# Patient Record
Sex: Male | Born: 1960 | Hispanic: No | Marital: Married | Smoking: Never smoker
Health system: Southern US, Community
[De-identification: ages and names within clinical notes are randomized; demographics above are authoritative.]

## PROBLEM LIST (undated history)

## (undated) DIAGNOSIS — I10 Essential (primary) hypertension: Secondary | ICD-10-CM

---

## 2020-06-15 ENCOUNTER — Other Ambulatory Visit: Payer: Self-pay

## 2020-06-15 ENCOUNTER — Emergency Department (HOSPITAL_COMMUNITY): Payer: BC Managed Care – PPO

## 2020-06-15 ENCOUNTER — Encounter (HOSPITAL_COMMUNITY): Payer: Self-pay | Admitting: Emergency Medicine

## 2020-06-15 ENCOUNTER — Emergency Department (HOSPITAL_COMMUNITY)
Admission: EM | Admit: 2020-06-15 | Discharge: 2020-06-15 | Disposition: A | Discharge status: Home / Self Care | Payer: BC Managed Care – PPO | Attending: Emergency Medicine | Admitting: Emergency Medicine

## 2020-06-15 DIAGNOSIS — M25551 Pain in right hip: Secondary | ICD-10-CM | POA: Diagnosis present

## 2020-06-15 DIAGNOSIS — M7918 Myalgia, other site: Secondary | ICD-10-CM

## 2020-06-15 DIAGNOSIS — W19XXXA Unspecified fall, initial encounter: Secondary | ICD-10-CM

## 2020-06-15 DIAGNOSIS — I10 Essential (primary) hypertension: Secondary | ICD-10-CM | POA: Insufficient documentation

## 2020-06-15 DIAGNOSIS — W010XXA Fall on same level from slipping, tripping and stumbling without subsequent striking against object, initial encounter: Secondary | ICD-10-CM | POA: Diagnosis not present

## 2020-06-15 HISTORY — DX: Essential (primary) hypertension: I10

## 2020-06-15 LAB — BASIC METABOLIC PANEL
Anion gap: 10 (ref 5–15)
BUN: 18 mg/dL (ref 6–20)
CO2: 23 mmol/L (ref 22–32)
Calcium: 9.3 mg/dL (ref 8.9–10.3)
Chloride: 104 mmol/L (ref 98–111)
Creatinine, Ser: 1.33 mg/dL — ABNORMAL HIGH (ref 0.61–1.24)
GFR, Estimated: >60 mL/min (ref >60)
Glucose, Bld: 99 mg/dL (ref 70–99)
Potassium: 4 mmol/L (ref 3.5–5.1)
Sodium: 137 mmol/L (ref 135–145)

## 2020-06-15 LAB — CBC
HCT: 48.5 % (ref 39.0–52.0)
Hemoglobin: 15.7 g/dL (ref 13.0–17.0)
MCH: 29.2 pg (ref 26.0–34.0)
MCHC: 32.4 g/dL (ref 30.0–36.0)
MCV: 90.1 fL (ref 80.0–100.0)
Platelets: 278 10*3/uL (ref 150–400)
RBC: 5.38 MIL/uL (ref 4.22–5.81)
RDW: 13.6 % (ref 11.5–15.5)
WBC: 9.3 10*3/uL (ref 4.0–10.5)
nRBC: 0 % (ref 0.0–0.2)

## 2020-06-15 LAB — TROPONIN I (HIGH SENSITIVITY): Troponin I (High Sensitivity): 12 ng/L (ref <18)

## 2020-06-15 MED ORDER — NAPROXEN 250 MG PO TABS
500.0000 mg | ORAL_TABLET | Freq: Once | ORAL | Status: AC
  Administered 2020-06-15: 500 mg via ORAL
  Filled 2020-06-15: qty 2

## 2020-06-15 MED ORDER — LIDOCAINE 4 % EX PTCH: 1.0000 | MEDICATED_PATCH | Freq: Every day | CUTANEOUS | 0 refills | Status: AC

## 2020-06-15 NOTE — ED Notes (Signed)
Pt taken to xray at this time.

## 2020-06-15 NOTE — ED Notes (Signed)
Patient verbalizes understanding of discharge instructions. Prescriptions reviewed. Opportunity for questioning and answers were provided. Armband removed by staff, pt discharged from ED ambulatory. ° °

## 2020-06-15 NOTE — Discharge Instructions (Addendum)
The images that we obtained of your back today show a possible fracture to the second lumbar vertebra osteophyte, recommend you follow-up with your primary care doctor as well as the orthopedic service, I have provided their number on your discharge paperwork. Lidocaine patches were called in your pharmacy, please apply 1 patch over the area of your maximum pain daily, do not apply more than 1 patch at a time. Please also use Tylenol and ibuprofen as needed for pain. please return the emergency department if the pain worsens, you develop weakness in your leg, or unable to walk, or for any other emergent concern.

## 2020-06-15 NOTE — ED Notes (Signed)
Patient ambulate well

## 2020-06-15 NOTE — ED Provider Notes (Signed)
[facility] EMERGENCY DEPARTMENT Provider Note   CSN: [id number] Arrival date & time: 06/15/20  1716     History Chief Complaint  Patient presents with  . Fall  . Hip Pain  . Hypertension    Andre Harrington is a 60 y.o. male.  The history is provided by the patient.  Fall This is a new problem. The current episode started 2 days ago. The problem occurs constantly. The problem has not changed since onset.Pertinent negatives include no chest pain, no abdominal pain and no shortness of breath. The symptoms are aggravated by walking and standing. The symptoms are relieved by rest.  Hip Pain This is a new problem. The current episode started 2 days ago. Pertinent negatives include no chest pain, no abdominal pain and no shortness of breath.  Hypertension Pertinent negatives include no chest pain, no abdominal pain and no shortness of breath.       Past Medical History:  Diagnosis Date  . Hypertension     There are no problems to display for this patient.   History reviewed. No pertinent surgical history.     No family history on file.  Social History   Tobacco Use  . Smoking status: Never Smoker  . Smokeless tobacco: Never Used  Substance Use Topics  . Alcohol use: Not Currently  . Drug use: Not Currently    Home Medications Prior to Admission medications   Medication Sig Start Date End Date Taking? Authorizing Provider  bisoprolol (ZEBETA) 5 MG tablet Take 5 mg by mouth daily.   Yes [provider]  Lidocaine (HM LIDOCAINE PATCH) 4 % [facility] Apply 1 patch topically daily. 06/15/20  Yes Kathleen Lime, MD    Allergies    Patient has no known allergies.  Review of Systems   Review of Systems  Constitutional: Negative for chills and fever.  HENT: Negative for ear pain and sore throat.   Eyes: Negative for pain and visual disturbance.  Respiratory: Negative for cough and shortness of breath.   Cardiovascular: Negative for chest pain  and palpitations.  Gastrointestinal: Negative for abdominal pain and vomiting.  Genitourinary: Negative for dysuria and hematuria.  Musculoskeletal: Negative for arthralgias and back pain.       R buttock pain  Skin: Negative for color change and rash.  Neurological: Negative for seizures and syncope.  All other systems reviewed and are negative.   Physical Exam Updated Vital Signs BP (!) 166/110   Pulse 70   Temp 98.6 F (37 C) (Oral)   Resp 15   SpO2 98%   Physical Exam Vitals and nursing note reviewed.  Constitutional:      Appearance: He is well-developed and well-nourished.  HENT:     Head: Normocephalic and atraumatic.  Eyes:     Conjunctiva/sclera: Conjunctivae normal.  Cardiovascular:     Rate and Rhythm: Normal rate and regular rhythm.     Heart sounds: No murmur heard.   Pulmonary:     Effort: Pulmonary effort is normal. No respiratory distress.     Breath sounds: Normal breath sounds.  Abdominal:     Palpations: Abdomen is soft.     Tenderness: There is no abdominal tenderness.  Musculoskeletal:        General: No edema.     Cervical back: Neck supple.     Lumbar back: Normal range of motion.     Comments: Mild right SI joint tenderness, no significant midline back tenderness. 5/5 strength bilateral lower extremities.  Stable gait.   Skin:    General: Skin is warm and dry.  Neurological:     Mental Status: He is alert.     GCS: GCS eye subscore is 4. GCS verbal subscore is 5. GCS motor subscore is 6.     Sensory: Sensation is intact. No sensory deficit.     Motor: No weakness.     Deep Tendon Reflexes:     Reflex Scores:      Achilles reflexes are 2+ on the right side and 2+ on the left side. Psychiatric:        Mood and Affect: Mood and affect normal.     ED Results / Procedures / Treatments   Labs (all labs ordered are listed, but only abnormal results are displayed) Labs Reviewed  BASIC METABOLIC PANEL - Abnormal; Notable for the following  components:      Result Value   Creatinine, Ser 1.33 (*)    All other components within normal limits  CBC  TROPONIN I (HIGH SENSITIVITY)  TROPONIN I (HIGH SENSITIVITY)    EKG EKG Interpretation  Date/Time:  Sunday June 15 2020 17:53:57 EST Ventricular Rate:  88 PR Interval:  150 QRS Duration: 100 QT Interval:  386 QTC Calculation: 467 R Axis:   26 Text Interpretation: Normal sinus rhythm Left ventricular hypertrophy with repolarization abnormality ( R in aVL , Sokolow-Lyon , Cornell product , Romhilt-Estes ) Lateral infarct , age undetermined T wave abnormality Abnormal ECG Confirmed by Gerhard Munch [phone]) on 06/15/2020 6:42:31 PM   Radiology DG Lumbar Spine Complete  Result Date: 06/15/2020 CLINICAL DATA:  Fall with lower lumbar and right SI joint pain. Slip and fall 2 days ago. EXAM: LUMBAR SPINE - COMPLETE 4+ VIEW COMPARISON:  None. FINDINGS: Normal alignment. Endplate spurring at multiple levels. There is a lucency through the right aspect of L2 peripheral osteophyte, cannot exclude osteophyte fracture. The vertebral body heights are normal. The sacroiliac joints are congruent with mild degenerative change. Minor disc space narrowing throughout the lumbar spine. Mild lower lumbar facet hypertrophy. IMPRESSION: 1. Lucency through the right aspect of L2 peripheral osteophyte, cannot exclude osteophyte fracture. 2. Mild multilevel degenerative disc disease with lower lumbar facet hypertrophy. Electronically Signed   By: Narda Rutherford M.D.   On: 06/15/2020 19:37   CT Lumbar Spine Wo Contrast  Result Date: 06/15/2020 CLINICAL DATA:  Lumbar and right SI joint pain after fall. Question fracture on radiograph. EXAM: CT LUMBAR SPINE WITHOUT CONTRAST TECHNIQUE: Multidetector CT imaging of the lumbar spine was performed without intravenous contrast administration. Multiplanar CT image reconstructions were also generated. COMPARISON:  Radiograph earlier today FINDINGS: Segmentation: 5  lumbar type vertebrae. Alignment: Normal. Vertebrae: As seen on radiograph, there is a lucency through anterior osteophyte arising from the inferior aspect of L2 vertebral body that may represent an osteophyte fracture, series 7, image 31, series 4, images 54 and 55. There is no vertebral body involvement. No other fracture. Vertebral body heights are normal. Posterior elements are intact. Symmetric sacroiliac joints with degenerative spurring about the superior aspect. No fracture of the visualized sacrum. Paraspinal and other soft tissues: Negative. Disc levels: Multilevel endplate spurring with primarily anterior osteophytes. Minor disc space narrowing at L2-L3. Mild facet and ligamentum flavum hypertrophy at L4-L5. No high-grade canal stenosis. IMPRESSION: 1. Lucency through anterior osteophyte arising from the inferior aspect of L2 vertebral body may represent an osteophyte fracture. There is no vertebral body involvement. 2. Mild multilevel degenerative disc disease. Electronically Signed  By: Narda Rutherford M.D.   On: 06/15/2020 21:28    Procedures Procedures   Medications Ordered in ED Medications  naproxen (NAPROSYN) tablet 500 mg (500 mg Oral Given 06/15/20 1957)    ED Course  I have reviewed the triage vital signs and the nursing notes.  Pertinent labs & imaging results that were available during my care of the patient were reviewed by me and considered in my medical decision making (see chart for details).    MDM Rules/Calculators/A&P                          This is a 60 year old male with a past medical history of hypertension who presents emergency department for evaluation of right buttock and lower back pain after a fall 2 days ago.  Patient reports that the garbage truck was coming to take his trash out and he was walking back, looking at the garbage truck when his left leg stumbled over a railing that he did not know was there causing him to fall onto his right hip and right  lower backside.  Patient did not hit his head, did not lose consciousness.  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  He does not have any history of back problems, denies any urinary or fecal incontinence, denies saddle paresthesias, denies lower leg weakness/numbness.  On exam the patient is afebrile, hypertensive, no acute distress.  He points to the area overlying his right SI joint and right buttock as the sites of pain when he stands up, however I am unable to reproduce this with deep palpation.  He has equal strength and sensation to bilateral lower extremities, ambulates with steady gait. His pain appears to be MSK as it is worse with standing and moving and improves with rest.  XR of lumbar spine shows lucency through R L2 osteophyte, CT obtained which shows similar findings that are not definitive for fracture, but osteophyte fracture cannot be ruled out. He has no neurovascular deficits, no emergent spine consult required. We will provided him with lidocaine patches, instruct to use tylenol/ibuprofen for pain, and given Spine follow-up if pain not improving. He was hypertensive on arrival and is known to be non-compliant with his BP meds. I discussed the importance to take his medications even if he is not having symptoms. His creatinine is mildly elevated today, but we have no previous for comparison. This is likely secondary to his chronic HTN, he was instructed to call his PCP and f/u for trending and to address possible hypertensive induced CKD.   Final Clinical Impression(s) / ED Diagnoses Final diagnoses:  Fall, initial encounter  Right buttock pain    Rx / DC Orders ED Discharge Orders         Ordered    Lidocaine (HM LIDOCAINE PATCH) 4 % [facility]  Daily        06/15/20 2139            Kathleen Lime, MD 06/16/20 1144    Gerhard Munch, MD 06/19/20 0001

## 2020-06-15 NOTE — ED Triage Notes (Addendum)
Pt slipped and fell on Friday.  C/o pain to R hip.  Ambulatory to triage.  Pt hypertensive.  States he only takes BP medicine when he feels dizzy.

## 2022-07-31 IMAGING — DX DG LUMBAR SPINE COMPLETE 4+V
5 series · 5 of 5 positions shown · non-contrast
Comparison: None.

CLINICAL DATA: Fall with lower lumbar and right SI joint pain. Slip
and fall 2 days ago.

EXAM:
LUMBAR SPINE - COMPLETE 4+ VIEW

[l-spine ap]
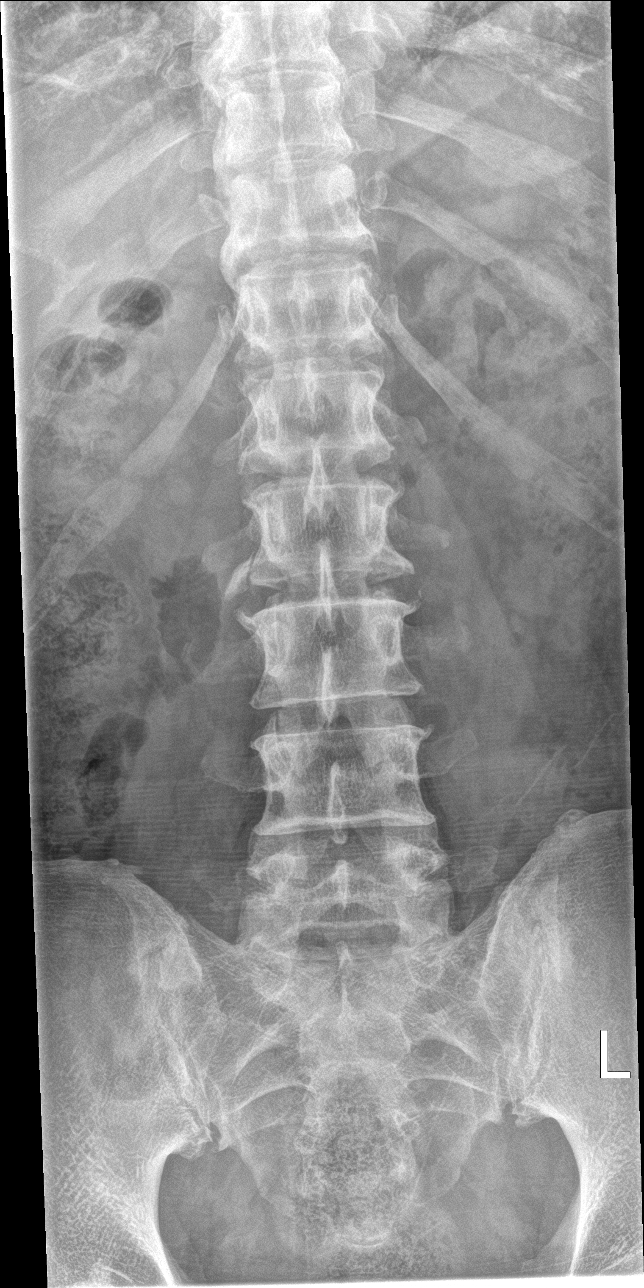

[l-spine obl (1 of 2)]
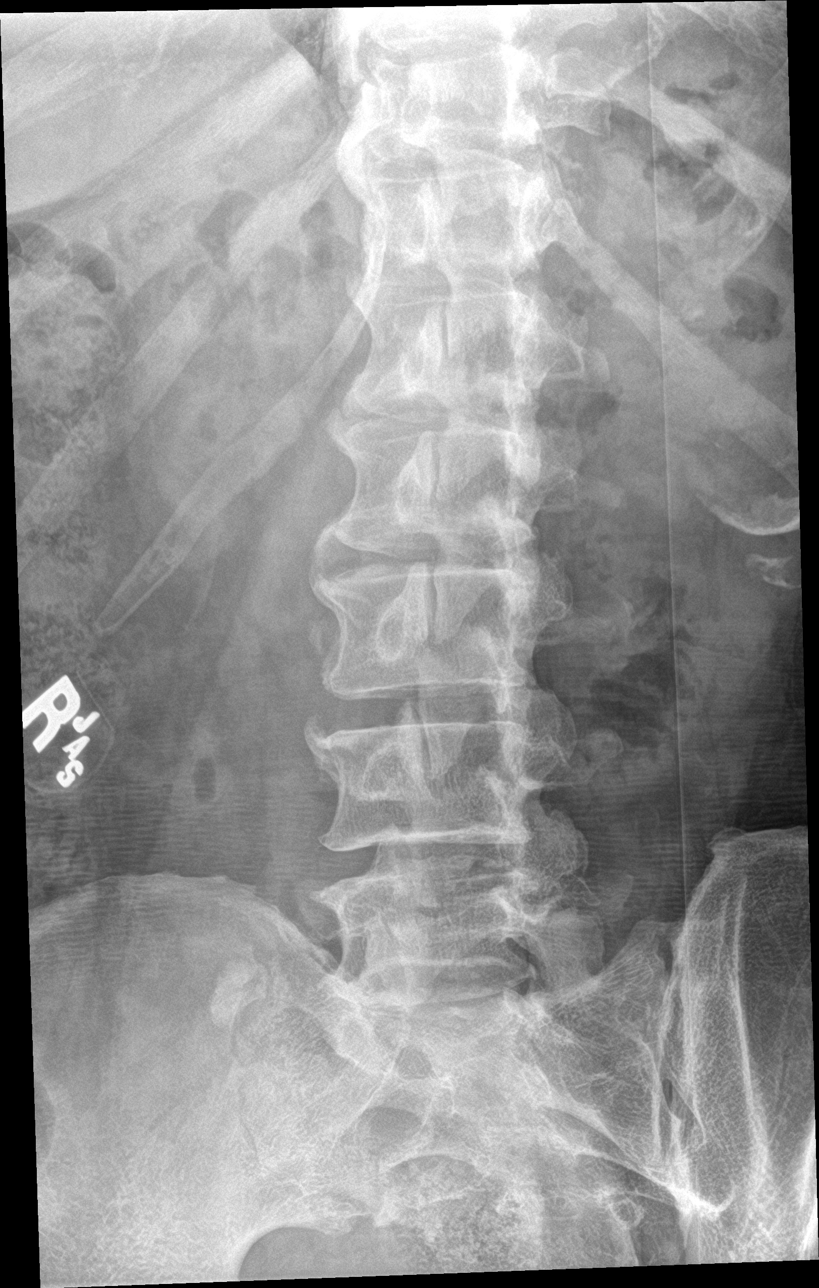

[l-spine obl (2 of 2)]
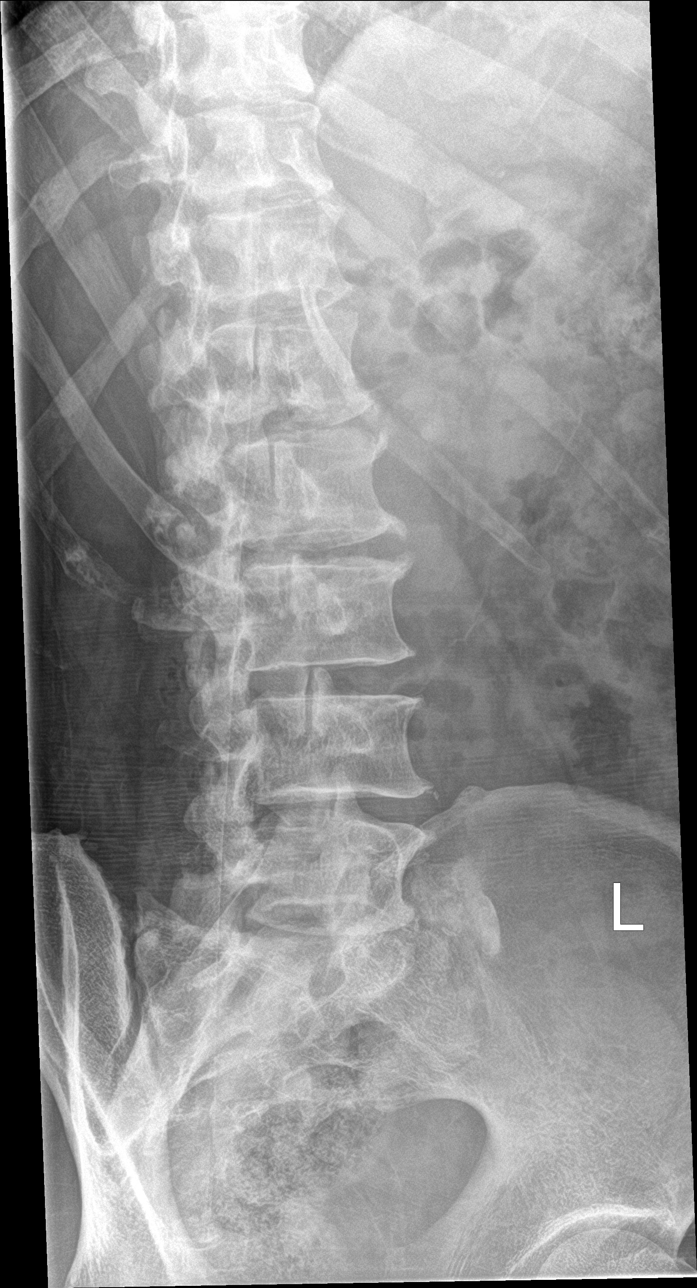

[l-spine lat]
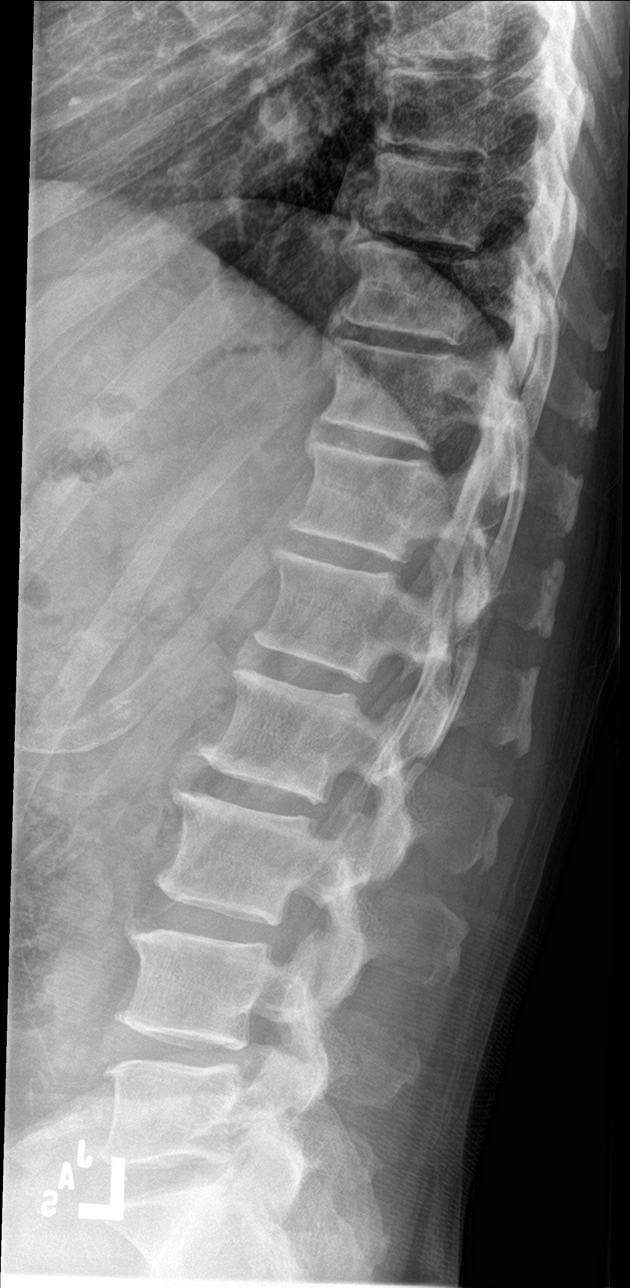

[l-spine spot]
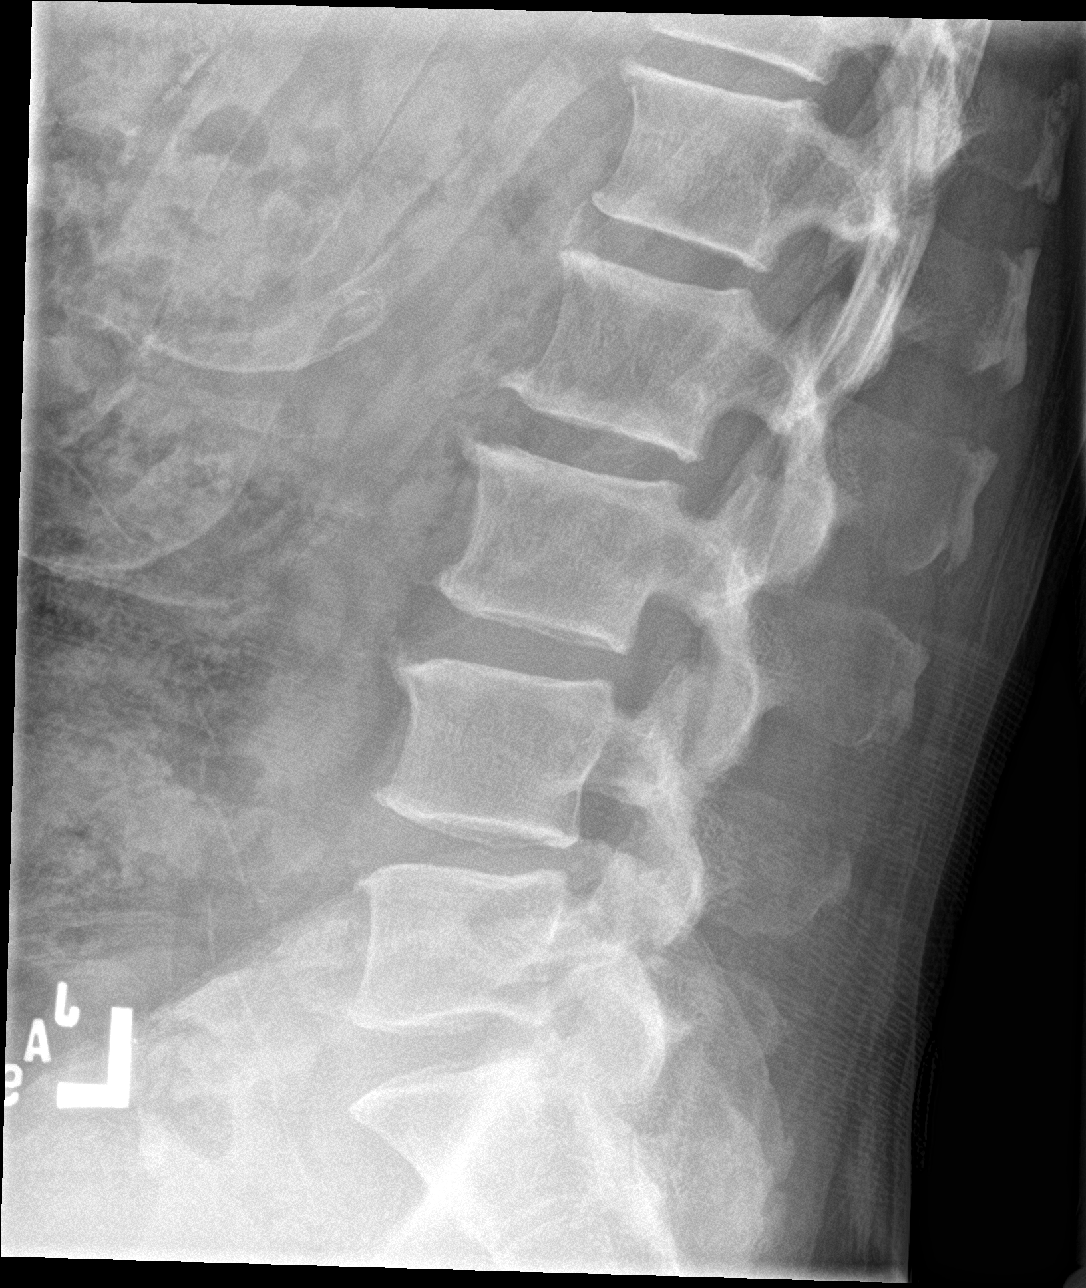

[5 of 5 positions shown; findings below may reference images not displayed]

FINDINGS: Normal alignment. Endplate spurring at multiple levels. There is a
lucency through the right aspect of L2 peripheral osteophyte, cannot
exclude osteophyte fracture. The vertebral body heights are normal.
The sacroiliac joints are congruent with mild degenerative change.
Minor disc space narrowing throughout the lumbar spine. Mild lower
lumbar facet hypertrophy.
IMPRESSION: 1. Lucency through the right aspect of L2 peripheral osteophyte,
cannot exclude osteophyte fracture.
2. Mild multilevel degenerative disc disease with lower lumbar facet
hypertrophy.

## 2022-07-31 IMAGING — CT CT L SPINE W/O CM
3 series · 12 of 33 positions shown, 14 images · non-contrast
Comparison: Radiograph earlier today

CLINICAL DATA: Lumbar and right SI joint pain after fall. Question
fracture on radiograph.

EXAM:
CT LUMBAR SPINE WITHOUT CONTRAST
TECHNIQUE: Multidetector CT imaging of the lumbar spine was performed without
intravenous contrast administration. Multiplanar CT image
reconstructions were also generated.

[Series 3: l-spine 2.0 st · axial · 0.38mm/px · z∈[+1084,+1264]mm · 4 of 130 slices shown, 5 images]
[im 20/130  soft-tissue]
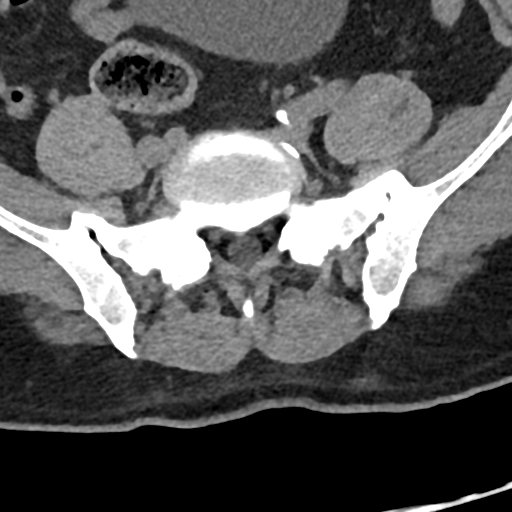
[im 20/130  bone]
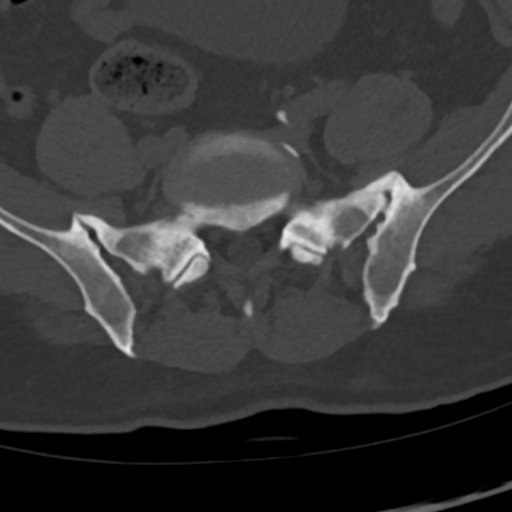
[im 50/130  bone]
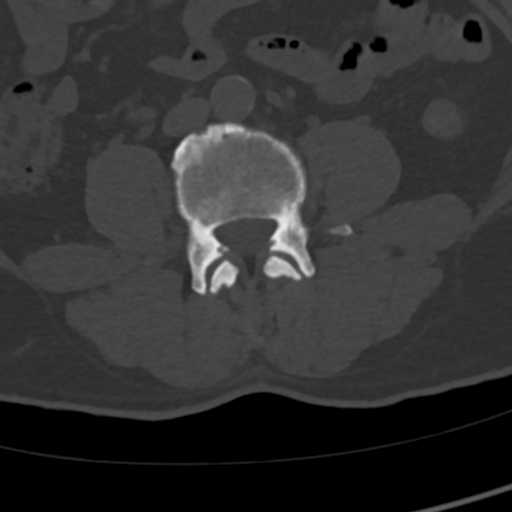
[im 80/130  bone]
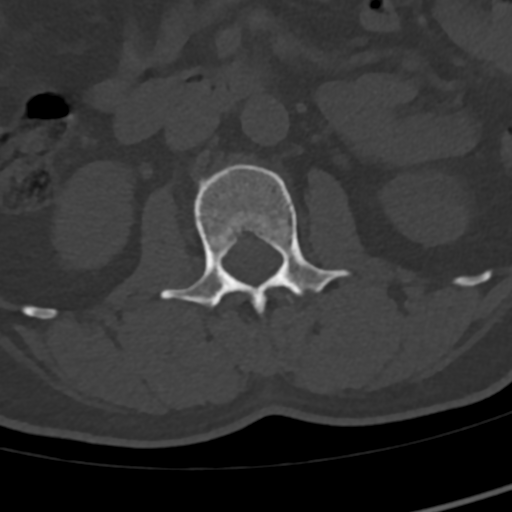
[im 110/130  bone]
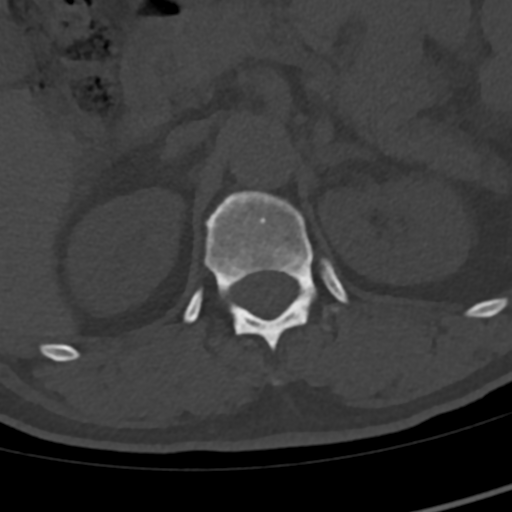

[Series 8: l-spine 2.0 cor · coronal · 0.38mm/px · 3 of 72 slices shown]
[im 15/72  bone]
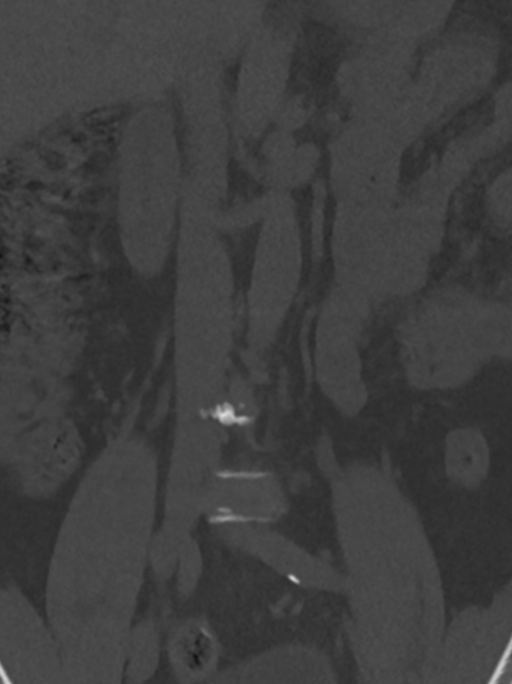
[im 29/72  bone]
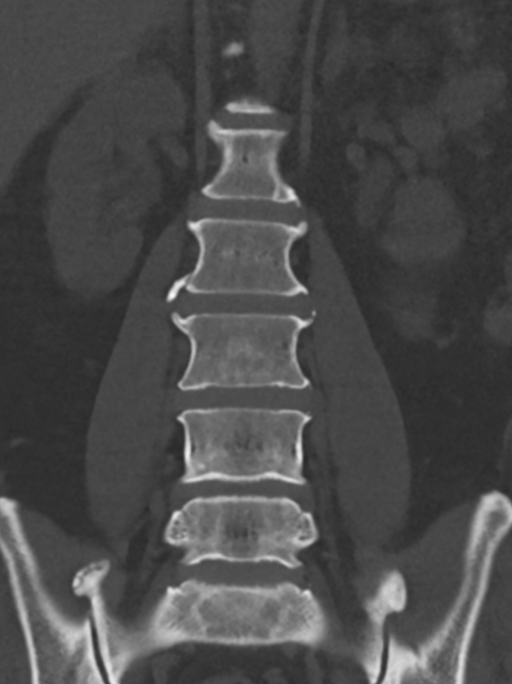
[im 43/72  bone]
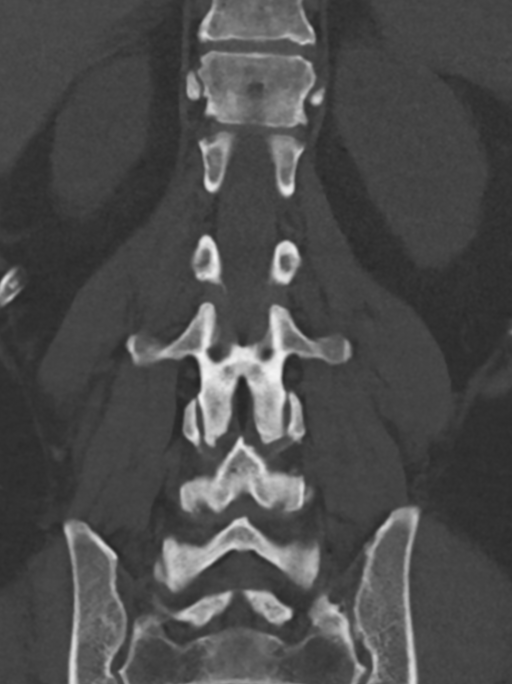

[Series 10: l-spine 2.0 sag · sagittal · 0.28mm/px · 5 of 98 slices shown, 6 images]
[im 33/98  bone]
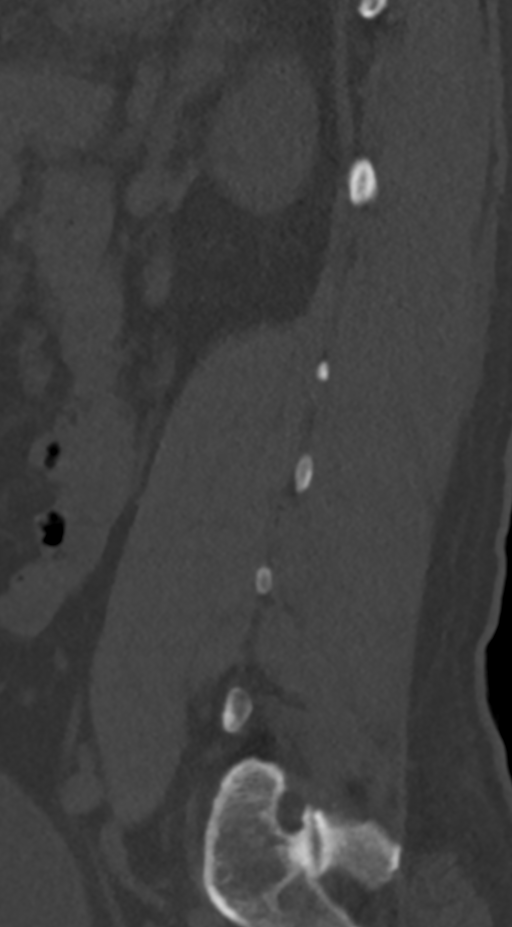
[im 41/98  bone]
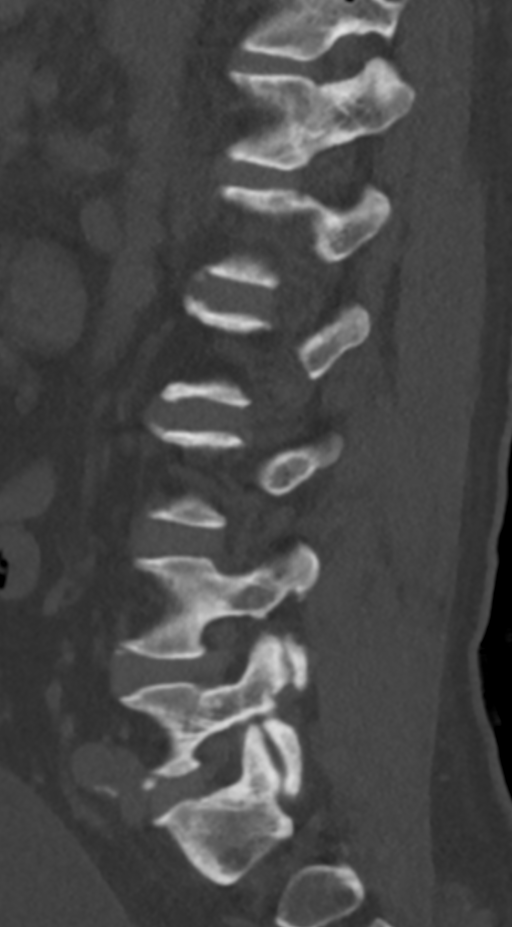
[im 49/98  soft-tissue]
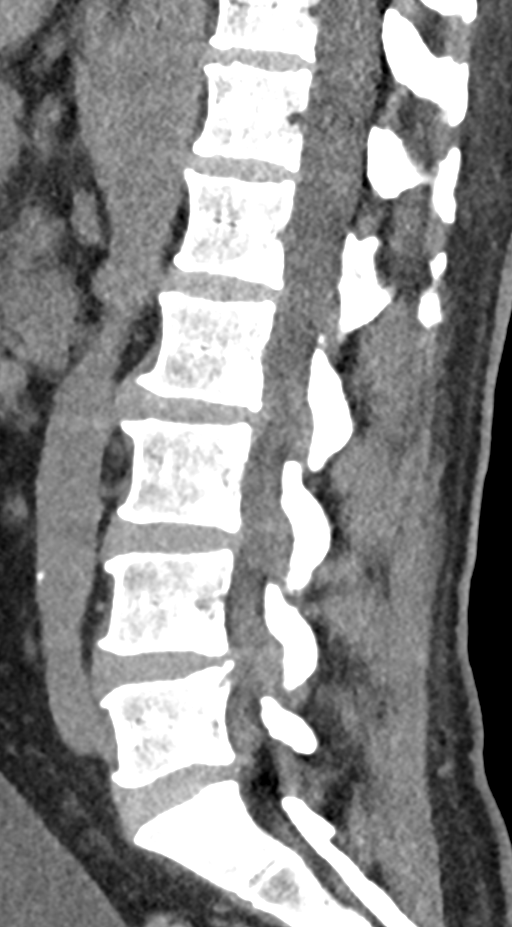
[im 49/98  bone]
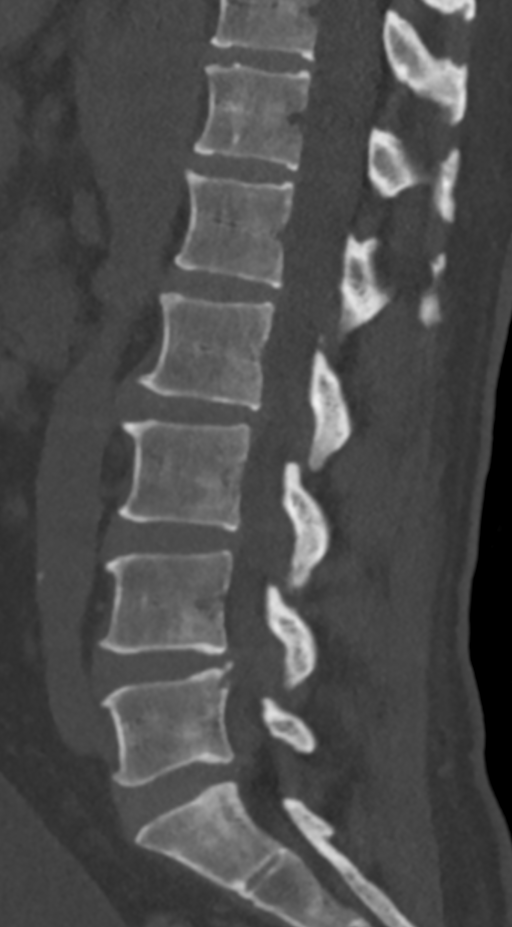
[im 57/98  bone]
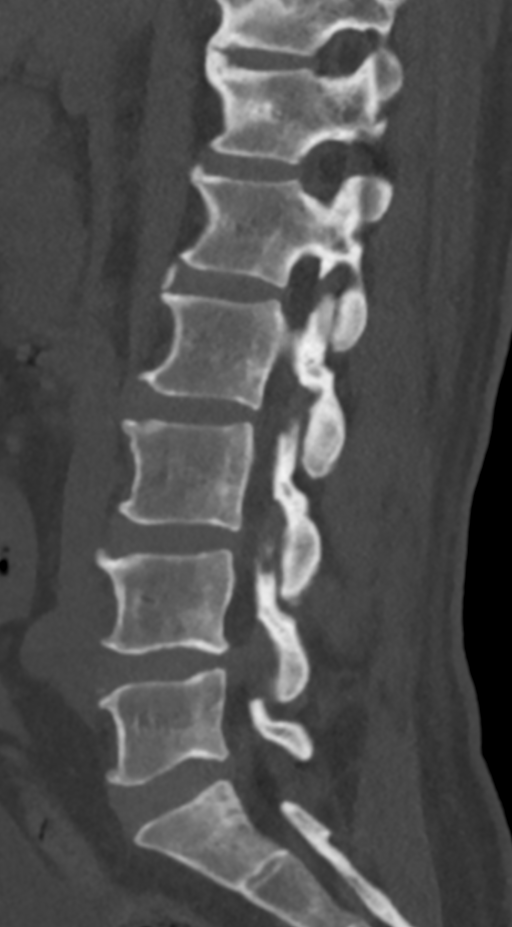
[im 65/98  bone]
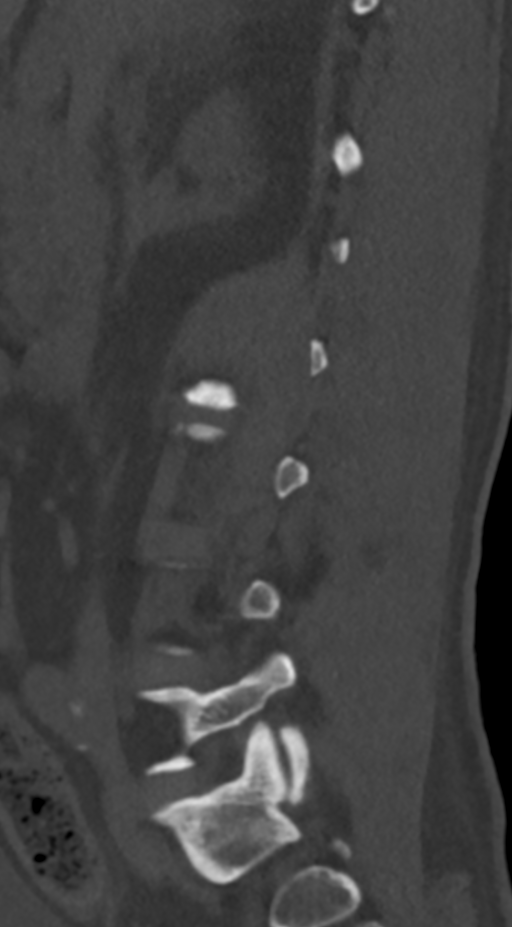

[12 of 33 positions shown; findings below may reference images not displayed]

FINDINGS: Segmentation: 5 lumbar type vertebrae.

Alignment: Normal.

Vertebrae: As seen on radiograph, there is a lucency through
anterior osteophyte arising from the inferior aspect of L2 vertebral
body that may represent an osteophyte fracture, series 7, image 31,
series 4, images 54 and 55. There is no vertebral body involvement.
No other fracture. Vertebral body heights are normal. Posterior
elements are intact. Symmetric sacroiliac joints with degenerative
spurring about the superior aspect. No fracture of the visualized
sacrum.

Paraspinal and other soft tissues: Negative.

Disc levels: Multilevel endplate spurring with primarily anterior
osteophytes. Minor disc space narrowing at L2-L3. Mild facet and
ligamentum flavum hypertrophy at L4-L5. No high-grade canal
stenosis.
IMPRESSION: 1. Lucency through anterior osteophyte arising from the inferior
aspect of L2 vertebral body may represent an osteophyte fracture.
There is no vertebral body involvement.
2. Mild multilevel degenerative disc disease.
